# Patient Record
Sex: Female | Born: 1965 | Race: White | Hispanic: No | Marital: Married | Smoking: Current every day smoker
Health system: Southern US, Community
[De-identification: ages and names within clinical notes are randomized; demographics above are authoritative.]

## PROBLEM LIST (undated history)

## (undated) DIAGNOSIS — G43909 Migraine, unspecified, not intractable, without status migrainosus: Secondary | ICD-10-CM

## (undated) DIAGNOSIS — M2550 Pain in unspecified joint: Secondary | ICD-10-CM

## (undated) DIAGNOSIS — F419 Anxiety disorder, unspecified: Secondary | ICD-10-CM

## (undated) DIAGNOSIS — H269 Unspecified cataract: Secondary | ICD-10-CM

## (undated) DIAGNOSIS — D4959 Neoplasm of unspecified behavior of other genitourinary organ: Secondary | ICD-10-CM

## (undated) DIAGNOSIS — C439 Malignant melanoma of skin, unspecified: Secondary | ICD-10-CM

## (undated) HISTORY — DX: Anxiety disorder, unspecified: F41.9

## (undated) HISTORY — PX: CATARACT EXTRACTION, BILATERAL: SHX1313

## (undated) HISTORY — DX: Unspecified cataract: H26.9

## (undated) HISTORY — DX: Neoplasm of unspecified behavior of other genitourinary organ: D49.59

## (undated) HISTORY — DX: Pain in unspecified joint: M25.50

## (undated) HISTORY — PX: MOHS SURGERY: SUR867

## (undated) HISTORY — PX: APPENDECTOMY: SHX54

## (undated) HISTORY — PX: TONSILLECTOMY AND ADENOIDECTOMY: SUR1326

## (undated) HISTORY — DX: Malignant melanoma of skin, unspecified: C43.9

## (undated) HISTORY — DX: Migraine, unspecified, not intractable, without status migrainosus: G43.909

---
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
## 2000-11-09 HISTORY — PX: MELANOMA EXCISION: SHX5266

## 2004-09-16 ENCOUNTER — Ambulatory Visit: Payer: Self-pay | Admitting: Family Medicine

## 2004-10-24 ENCOUNTER — Ambulatory Visit: Payer: Self-pay | Admitting: Internal Medicine

## 2009-02-06 ENCOUNTER — Ambulatory Visit: Payer: Self-pay | Admitting: Family Medicine

## 2009-08-16 ENCOUNTER — Emergency Department: Payer: Self-pay | Admitting: Emergency Medicine

## 2011-11-10 HISTORY — PX: ESOPHAGOGASTRODUODENOSCOPY: SHX1529

## 2011-11-10 HISTORY — PX: COLONOSCOPY: SHX174

## 2012-04-05 ENCOUNTER — Ambulatory Visit: Payer: Self-pay | Admitting: Family Medicine

## 2012-06-30 ENCOUNTER — Ambulatory Visit: Payer: Self-pay | Admitting: Gastroenterology

## 2012-09-09 IMAGING — NM NUCLEAR MEDICINE HEPATOHBILIARY INCLUDE GB
2 series · 12 of 12 positions shown · non-contrast
Comparison: none

REASON FOR EXAM: abd pain
COMMENTS:
TECHNIQUE: Following the uneventful intravenous infusion of
radiopharmaceutical, dynamic anterior regional imaging was obtained over the
liver for 75 minutes.

[Series 1000: gallbladder dynamic (results) · 4.80mm/px · 6 of 60 frames shown]
[frame 6/60]
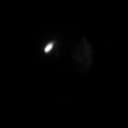
[frame 16/60]
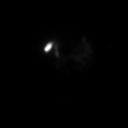
[frame 26/60]
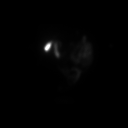
[frame 36/60]
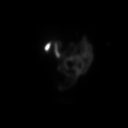
[frame 46/60]
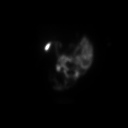
[frame 56/60]
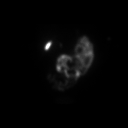

[Series 1000: gallbladder dynamic · 4.80mm/px · 6 of 60 frames shown]
[frame 6/60]
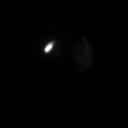
[frame 16/60]
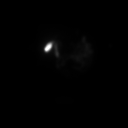
[frame 26/60]
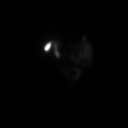
[frame 36/60]
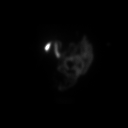
[frame 46/60]
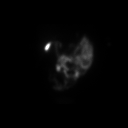
[frame 56/60]
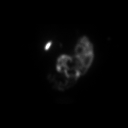

[12 of 12 positions shown; findings below may reference images not displayed]

PROCEDURE:     KNM - KNM HEPATO W/GB EJECT FRACTION  - June 30, 2012 [DATE]

RESULT:     Comparison: None.

Radiopharmaceutical: 8.78 mCi 8c-BBm labeled Choletec was administered
intravenously. Once the gallbladder had accumulated tracer, the patient was
given CCK intravenously per standard protocol.
FINDINGS: There is immediate homogeneous uptake of radiotracer in the liver.
Filling of the gallbladder begins at 15 minutes. Radiotracer uptake is
present in the small bowel at 75 minutes.

When gallbladder filling was complete, the patient was given an infusion of
1.49 mcg CCK over 30 minutes. At 30 minutes, the total ejection fraction was
80% which is within normal limits.
IMPRESSION: 1. The common bile duct and cystic duct are patent.
2. Normal gallbladder ejection fraction.

[REDACTED]

## 2016-03-31 ENCOUNTER — Encounter: Payer: Self-pay | Admitting: Internal Medicine

## 2016-03-31 ENCOUNTER — Ambulatory Visit (INDEPENDENT_AMBULATORY_CARE_PROVIDER_SITE_OTHER): Payer: BLUE CROSS/BLUE SHIELD | Admitting: Internal Medicine

## 2016-03-31 VITALS — BP 110/84 | HR 84 | Resp 16 | Ht 65.0 in | Wt 168.0 lb

## 2016-03-31 DIAGNOSIS — R1012 Left upper quadrant pain: Secondary | ICD-10-CM

## 2016-03-31 DIAGNOSIS — F17200 Nicotine dependence, unspecified, uncomplicated: Secondary | ICD-10-CM | POA: Insufficient documentation

## 2016-03-31 DIAGNOSIS — D0330 Melanoma in situ of unspecified part of face: Secondary | ICD-10-CM | POA: Diagnosis not present

## 2016-03-31 DIAGNOSIS — G43009 Migraine without aura, not intractable, without status migrainosus: Secondary | ICD-10-CM

## 2016-03-31 MED ORDER — DICYCLOMINE HCL 10 MG PO CAPS: 10.0000 mg | ORAL_CAPSULE | Freq: Three times a day (TID) | ORAL | Status: DC

## 2016-03-31 NOTE — Progress Notes (Signed)
Date:  03/31/2016   Name:  Sabrina Petersen   DOB:  [birth year]   MRN:  [id number]   Chief Complaint: Establish Care Gall Bladder slow releasing per GI 2-3 years ago. Did endoscopy as well as colonoscopy and U/S. Never said if it needed to be removed only said it could be. She is wondering if she needs second opinion. She also has pain under right rib and wonders if Tylenol has caused issue with Liver since she takes alot of it with her aspirin allergy.   Abdominal Pain This is a recurrent problem. The current episode started more than 1 year ago. The pain is located in the epigastric region and LUQ. The quality of the pain is sharp and cramping. The abdominal pain radiates to the LUQ. Associated symptoms include arthralgias, diarrhea, flatus, headaches and vomiting. Pertinent negatives include no constipation, fever or weight loss. The pain is relieved by certain positions (stretching to the left). Prior diagnostic workup includes lower endoscopy, upper endoscopy, GI consult and ultrasound.  Migraine  This is a recurrent problem. The current episode started more than 1 year ago. The problem has been gradually improving. Associated symptoms include abdominal pain and vomiting. Pertinent negatives include no dizziness, fever or weight loss. She has tried acetaminophen for the symptoms. The treatment provided significant relief.  Per patient - EGD and Colonoscopy were normal in 2013.  HIDA scan normal (viewed in [place]) She was never treated with any medication except maybe a brief of course of Nexium that was stopped due to lack of efficacy.  Previous evaluation by Dr Dionne Milo.  She has not changed her diet.  She is not aware of any foods that trigger symptoms.  Review of Systems  Constitutional: Negative for fever, chills, weight loss, fatigue and unexpected weight change.  Respiratory: Positive for shortness of breath (mild with exertion). Negative for chest tightness.   Cardiovascular: Negative  for chest pain, palpitations and leg swelling.  Gastrointestinal: Positive for vomiting, abdominal pain, diarrhea, abdominal distention and flatus. Negative for constipation, blood in stool and rectal pain.  Musculoskeletal: Positive for arthralgias.  Neurological: Positive for headaches. Negative for dizziness and light-headedness.  Hematological: Negative for adenopathy.    Patient Active Problem List   Diagnosis Date Noted  . 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 Authorizing Provider  acetaminophen (TYLENOL) 325 MG tablet Take 650 mg by mouth every 6 (six) hours as needed.   Yes Historical Provider, MD    Allergies  Allergen Reactions  . Aspirin Anaphylaxis and Swelling  . Keflex [Cephalexin] Shortness Of Breath and Palpitations    Past Surgical History  Procedure Laterality Date  . Appendectomy    . Cesarean section      2  . Tonsillectomy and adenoidectomy    . Esophagogastroduodenoscopy  2013  . Colonoscopy  2013  . Melanoma excision  2002  . Cataract extraction, bilateral      Social History  Substance Use Topics  . Smoking status: Current Every Day Smoker -- 1.00 packs/day for 28 years    Types: Cigarettes  . Smokeless tobacco: Never Used  . Alcohol Use: No     Medication list has been reviewed and updated.   Physical Exam  Constitutional: She is oriented to person, place, and time. She appears well-developed. No distress.  HENT:  Head: Normocephalic and atraumatic.  Neck: Normal range of motion. Neck supple.  No thyromegaly present.  Cardiovascular: Normal rate, regular rhythm and normal heart sounds.   Pulmonary/Chest: Effort normal and breath sounds normal. No respiratory distress. She has no wheezes. She has no rales.  Abdominal: Soft. Bowel sounds are normal. She exhibits no distension and no mass. There is tenderness in the right upper quadrant and epigastric area.  There is no rebound and no guarding.  Mild discomfort to deep palpation  Musculoskeletal: Normal range of motion.  Neurological: She is alert and oriented to person, place, and time.  Skin: Skin is warm and dry. No rash noted.  Psychiatric: She has a normal mood and affect. Her behavior is normal. Thought content normal.  Nursing note and vitals reviewed.   BP 110/84 mmHg  Pulse 84  Resp 16  Ht 5\' 5"  (1.651 m)  Wt 168 lb (76.204 kg)  BMI 27.96 kg/m2  SpO2 97%  LMP 03/13/2016  Assessment and Plan: 1. Left upper quadrant pain Will rule out H Pylori and treat empirically for IBS while waiting for GI evaluation - H. pylori antibody, IgG - dicyclomine (BENTYL) 10 MG capsule; Take 1 capsule (10 mg total) by mouth 4 (four) times daily -  before meals and at bedtime.  Dispense: 120 capsule; Refill: 1 - Ambulatory referral to Gastroenterology  2. Melanoma in situ of face [facility]) Recommend annual Dermatology follow up  3. Migraine without aura and without status migrainosus, not intractable Intermittent - responds to tylenol PRN   Halina Maidens, MD [facility]  03/31/2016

## 2016-04-01 LAB — H. PYLORI ANTIBODY, IGG: H Pylori IgG: <0.9 U/mL (ref 0.0–0.8)

## 2016-04-30 ENCOUNTER — Ambulatory Visit (INDEPENDENT_AMBULATORY_CARE_PROVIDER_SITE_OTHER): Payer: BLUE CROSS/BLUE SHIELD | Admitting: Gastroenterology

## 2016-04-30 ENCOUNTER — Encounter: Payer: Self-pay | Admitting: Gastroenterology

## 2016-04-30 VITALS — BP 134/65 | HR 74 | Temp 98.6°F | Ht 65.0 in | Wt 168.0 lb

## 2016-04-30 DIAGNOSIS — R1010 Upper abdominal pain, unspecified: Secondary | ICD-10-CM

## 2016-04-30 DIAGNOSIS — G8929 Other chronic pain: Secondary | ICD-10-CM

## 2016-04-30 DIAGNOSIS — K591 Functional diarrhea: Secondary | ICD-10-CM

## 2016-04-30 DIAGNOSIS — R1011 Right upper quadrant pain: Principal | ICD-10-CM

## 2016-04-30 NOTE — Progress Notes (Signed)
Gastroenterology Consultation  Referring Provider:     Glean Hess, MD Primary Care Physician:  Halina Maidens, MD Primary Gastroenterologist:  Dr. Allen Norris     Reason for Consultation:     Abdominal pain        HPI:   Sabrina Petersen is a 50 y.o. y/o female referred for consultation & management of Abdominal pain by Dr. Halina Maidens, MD.  This patient comes today with a report of abdominal pain.  The patient states that abdominal pain has been present for approximately a year.  The patient had a workup in the past by another gastroenterologist with an EGD and colonoscopy.  She also had a gallbladder emptying study that showed her gallbladder to be working fine.  The patient states that her symptoms are now associated with diarrhea.  She states she had diarrhea with the last episode but it resolved spontaneously.  The patient was started on dicyclomine and states that it just made her feel bad and did not help and if her symptoms.  She also reports that her abdominal pain as a crampy abdominal pain.  She feels better when she raises her right arm and stretches the area out.  There is no report of any black stools or bloody stools.  She also denies that the pain is made any better or worse with Greasy foods. The patient reports that when she has the diarrhea she has urgency.  Typically she states that when she is away from home visiting somebody else she has a hard time going to the bathroom because she is more comfortable at her own house and gives me the example that she can go a week without any diarrhea but it was start the second she comes home.  Past Medical History  Diagnosis Date  . Migraines   . Joint pain   . Melanoma ([place])   . Cataract     L and R  . Anxiety     Past Surgical History  Procedure Laterality Date  . Appendectomy    . Cesarean section      2  . Tonsillectomy and adenoidectomy    . Esophagogastroduodenoscopy  2013  . Colonoscopy  2013  . Melanoma excision   2002  . Cataract extraction, bilateral      Prior to Admission medications   Medication Sig Start Date End Date Taking? Authorizing Provider  acetaminophen (TYLENOL) 325 MG tablet Take 650 mg by mouth every 6 (six) hours as needed.   Yes Historical Provider, MD  dicyclomine (BENTYL) 10 MG capsule Take 1 capsule (10 mg total) by mouth 4 (four) times daily -  before meals and at bedtime. Patient not taking: Reported on 04/30/2016 03/31/16   Glean Hess, MD    Family History  Problem Relation Age of Onset  . Leukemia Mother   . Heart attack Father   . CAD Paternal Grandfather      Social History  Substance Use Topics  . Smoking status: Current Every Day Smoker -- 1.00 packs/day for 28 years    Types: Cigarettes  . Smokeless tobacco: Never Used  . Alcohol Use: No    Allergies as of 04/30/2016 - Review Complete 04/30/2016  Allergen Reaction Noted  . Aspirin Anaphylaxis and Swelling 03/31/2016  . Keflex [cephalexin] Shortness Of Breath and Palpitations 03/31/2016    Review of Systems:    All systems reviewed and negative except where noted in HPI.   Physical Exam:  BP 134/65 mmHg  Pulse 74  Temp(Src) 98.6 F (37 C) (Oral)  Ht 5\' 5"  (1.651 m)  Wt 168 lb (76.204 kg)  BMI 27.96 kg/m2  LMP 03/13/2016 Patient's last menstrual period was 03/13/2016. Psych:  Alert and cooperative. Normal mood and affect. General:   Alert,  Well-developed, well-nourished, pleasant and cooperative in NAD Head:  Normocephalic and atraumatic. Eyes:  Sclera clear, no icterus.   Conjunctiva pink. Ears:  Normal auditory acuity. Nose:  No deformity, discharge, or lesions. Mouth:  No deformity or lesions,oropharynx pink & moist. Neck:  Supple; no masses or thyromegaly. Lungs:  Respirations even and unlabored.  Clear throughout to auscultation.   No wheezes, crackles, or rhonchi. No acute distress. Heart:  Regular rate and rhythm; no murmurs, clicks, rubs, or gallops. Abdomen:  Normal bowel sounds.   No bruits.  Soft, Positive tenderness to palpation of the right upper quadrant which is present with the muscles flexed but is worse with the muscles relaxed and non-distended without masses, hepatosplenomegaly or hernias noted.  No guarding or rebound tenderness.     Rectal:  Deferred.  Msk:  Symmetrical without gross deformities.  Good, equal movement & strength bilaterally. Pulses:  Normal pulses noted. Extremities:  No clubbing or edema.  No cyanosis. Neurologic:  Alert and oriented x3;  grossly normal neurologically. Skin:  Intact without significant lesions or rashes.  No jaundice. Lymph Nodes:  No significant cervical adenopathy. Psych:  Alert and cooperative. Normal mood and affect.  Imaging Studies: No results found.  Assessment and Plan:   Sabrina Petersen is a 50 y.o. y/o female Who comes in today with a report of symptoms consistent with irritable bowel syndrome.  The patient's gallbladder emptying study had been negative in the past and so has her EGD and colonoscopy.  The patient is having urgency typically when she is home and states that she has a hard time moving her bowels when she is away from home except some times at work.  It was not helped by dicyclomine and she states that the side effects were worse.  The patient has been started on fiber to help bulk up her stools and avoid diarrhea.  She also states that she takes some homeopathic seeds to also help with firming up her stools. It is unlikely that this is the patient's gallbladder as the cause for symptoms. If the above does not work the patient has been told to take Imodium for her diarrhea and see if that helps with her intestinal spasms.  She has been told that if none of these things help she may need to undergo a repeat colonoscopy with biopsies to rule out microscopic colitis.The patient has been explained the plan and agrees with it   Note: This dictation was prepared with Dragon dictation along with smaller phrase  technology. Any transcriptional errors that result from this process are unintentional.

## 2016-06-23 ENCOUNTER — Encounter: Payer: Self-pay | Admitting: Emergency Medicine

## 2016-06-23 ENCOUNTER — Ambulatory Visit
Admission: EM | Admit: 2016-06-23 | Discharge: 2016-06-23 | Disposition: A | Discharge status: Home / Self Care | Payer: BLUE CROSS/BLUE SHIELD | Attending: Family Medicine | Admitting: Family Medicine

## 2016-06-23 DIAGNOSIS — M79602 Pain in left arm: Secondary | ICD-10-CM | POA: Diagnosis not present

## 2016-06-23 DIAGNOSIS — T148 Other injury of unspecified body region: Secondary | ICD-10-CM | POA: Diagnosis not present

## 2016-06-23 DIAGNOSIS — T148XXA Other injury of unspecified body region, initial encounter: Secondary | ICD-10-CM

## 2016-06-23 MED ORDER — METHOCARBAMOL 750 MG PO TABS: 1500.0000 mg | ORAL_TABLET | Freq: Three times a day (TID) | ORAL | 0 refills | Status: DC

## 2016-06-23 NOTE — Discharge Instructions (Signed)
Take medication as prescribed. Rest. Apply ice. Use sling and rest arm. Perform range of motion exercises multiple times per day as discussed.   Follow up with primary care or orthopedic this week for continued pain as discussed.   Follow up with your primary care physician this week as needed. Return to Urgent care for new or worsening concerns.

## 2016-06-23 NOTE — ED Provider Notes (Signed)
[facility] ____________________________________________  Time seen: Approximately 9:31 AM  I have reviewed the triage vital signs and the nursing notes.   HISTORY  Chief Complaint Arm Pain (left arm)   HPI Sabrina Petersen is a 50 y.o. female presents with complaint of left arm pain. Patient reports pain onset was 2 days ago. Patient reports she does often have some pain in her left upper arm and left shoulder and she frequently carries heavy items and a heavy purse. Patient reports 2 days ago she was walking through her house, and her child's left a dumbbell sitting in the floor. Patient reports that she hit her foot on the dumbbell causing her to go off balance and jerk her left arm back. Patient reports that in this process she had sudden onset of pain to her left upper arm. Patient reports the last 2 days she has continued with the pain. Patient reports his pain is been unresolved with over-the-counter Tylenol. Patient reports she is allergic to NSAIDs.  Reports is not uncommon for her to occasionally have some pain in the same area reports pain onset was directly after injury 2 days ago. Patient reports pain is primarily with movement. Patient reports pain is fully reproducible with direct palpation and active movement. Patient reports that if she is supporting her arm and sitting complete distal pain is mild but reports pain is moderate with movement. Patient states that she does still have full range of motion but some pain. Denies numbness or loss of sensation in left arm. Denies weakened hand grips or decrease hand sensation.   Denies fall or direct trauma. Denies any pain radiation. Denies recent sickness. Denies chest pain, shortness of breath, abdominal pain, abdominal pain, dysuria, neck or back pain. Denies any pain or injury.  PCP: [name]    Past Medical History:  Diagnosis Date  . Anxiety   . Cataract    L and R  . Joint pain   . Melanoma ([place])   .  Migraines     Patient Active Problem List   Diagnosis Date Noted  . 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 Tobacco use disorder 03/31/2016  . Left upper quadrant pain 03/31/2016  . Migraine without aura and without status migrainosus, not intractable 03/31/2016    Past Surgical History:  Procedure Laterality Date  . APPENDECTOMY    . CATARACT EXTRACTION, BILATERAL    . CESAREAN SECTION     2  . COLONOSCOPY  2013  . ESOPHAGOGASTRODUODENOSCOPY  2013  . MELANOMA EXCISION  2002  . TONSILLECTOMY AND ADENOIDECTOMY       No current facility-administered medications for this encounter.   Current Outpatient Prescriptions:  .  acetaminophen (TYLENOL) 325 MG tablet, Take 650 mg by mouth every 6 (six) hours as needed., Disp: , Rfl:  .  methocarbamol (ROBAXIN-750) 750 MG tablet, Take 2 tablets (1,500 mg total) by mouth 3 (three) times daily. Do not drive or operate machinery as can cause drowsiness., Disp: 18 tablet, Rfl: 0  Allergies Aspirin and Keflex [cephalexin]  Family History  Problem Relation Age of Onset  . Leukemia Mother   . Heart attack Father   . CAD Paternal Grandfather     Social History Social History  Substance Use Topics  . Smoking status: Current Every Day Smoker    Packs/day: 1.00    Years: 28.00    Types: Cigarettes  . Smokeless tobacco: Never Used  . Alcohol use No    Review of  Systems Constitutional: No fever/chills Eyes: No visual changes. ENT: No sore throat. Cardiovascular: Denies chest pain. Respiratory: Denies shortness of breath. Gastrointestinal: No abdominal pain.  No nausea, no vomiting.  No diarrhea.  No constipation. Genitourinary: Negative for dysuria. Musculoskeletal: Negative for back pain. as above.  Skin: Negative for rash. Neurological: Negative for headaches, focal weakness or numbness.  10-point ROS otherwise negative.  ____________________________________________   PHYSICAL EXAM:  VITAL SIGNS: ED Triage Vitals    Enc Vitals Group     BP 06/23/16 0826 135/75     Pulse Rate 06/23/16 0826 92     Resp 06/23/16 0826 16     Temp 06/23/16 0826 97.1 F (36.2 C)     Temp Source 06/23/16 0826 Tympanic     SpO2 06/23/16 0826 100 %     Weight --      Height --      Head Circumference --      Peak Flow --      Pain Score 06/23/16 0829 5     Pain Loc --      Pain Edu? --      Excl. in [place]? --     Constitutional: Alert and oriented. Well appearing and in no acute distress. Eyes: Conjunctivae are normal. PERRL. EOMI. ENT      Head: Normocephalic and atraumatic.      Nose: No congestion/rhinnorhea.      Mouth/Throat: Mucous membranes are moist. Cardiovascular: Normal rate, regular rhythm. Grossly normal heart sounds.  Good peripheral circulation. Respiratory: Normal respiratory effort without tachypnea nor retractions. Breath sounds are clear and equal bilaterally. No wheezes/rales/rhonchi. No chest or rib tenderness.  Gastrointestinal: Soft and nontender. No distention. No CVA tenderness.  Musculoskeletal:  Nontender with normal range of motion in all extremities. No midline cervical, thoracic or lumbar tenderness to palpation.   except : Left mid to proximal bicep muscle moderate tenderness to palpation and moderate pain with resisted right elbow flexion at same location, no pain with resisted right elbow extension , mild to moderate pain to right bicep with left arm abduction, negative drop arm test, negative impingement test, bilateral hand grips strong and equal, bilateral distal radial pulses equal and easily palpated. Left hand no motor or tendon deficits, easily able to touch each finger with left thumb, left-hand normal capillary refill and normal sensation. Left upper extremity otherwise nontender. No bony tenderness to left upper extremity.  Neurologic:  Normal speech and language. No gross focal neurologic deficits are appreciated. Speech is normal. No gait instability.  Skin:  Skin is warm, dry and  intact. No rash noted. Psychiatric: Mood and affect are normal. Speech and behavior are normal. Patient exhibits appropriate insight and judgment   ___________________________________________   LABS (all labs ordered are listed, but only abnormal results are displayed)  Labs Reviewed - No data to display   PROCEDURES Procedures   Left arm sling applied by RN. Neurovascular intact post application. __________________________________________   INITIAL IMPRESSION / ASSESSMENT AND PLAN / ED COURSE  Pertinent labs & imaging results that were available during my care of the patient were reviewed by me and considered in my medical decision making (see chart for details).  Well-appearing patient. No acute distress. Presents for the complaints of left arm pain. Patient denies fall or direct trauma. Reports some tension and left proximal arm at baseline from carrying heavy objects but reports pain immediately onset after she jerked her arm. Pain per patient is fully reproducible by direct palpation  and movement. Denies any pain radiation. Left arm without bony tenderness and patient denies trauma. Discussed with patient, will defer x-ray at this time and patient agrees to this. Patient is point muscular tender. Suspect muscular strain, counseled regarding conservative treatment and rest. Encourage patient to follow-up for any continued pain complaints due to potential for muscular tear. Will place patient in sling. Counseled regarding range of motion exercises such as pendulum exercises to be performed multiple times per day. Will treat patient with oral Robaxin. Information for orthopedic given for follow-up as needed. Discussed indication, risks and benefits of medications with patient.  Discussed follow up with Primary care physician this week. Discussed follow up and return parameters including no resolution or any worsening concerns. Patient verbalized understanding and agreed to plan.    ____________________________________________   FINAL CLINICAL IMPRESSION(S) / ED DIAGNOSES  Final diagnoses:  Left arm pain  Muscle strain     Discharge Medication List as of 06/23/2016  9:05 AM    START taking these medications   Details  methocarbamol (ROBAXIN-750) 750 MG tablet Take 2 tablets (1,500 mg total) by mouth 3 (three) times daily. Do not drive or operate machinery as can cause drowsiness., Starting Tue 06/23/2016, Normal        Note: This dictation was prepared with Dragon dictation along with smaller phrase technology. Any transcriptional errors that result from this process are unintentional.    Clinical Course      Marylene Land, NP 06/23/16 [phone]

## 2016-06-23 NOTE — ED Triage Notes (Signed)
Patient states that she started having left arm pain after she had jerked her left arm on Sunday night.  Patient reports increase pain when she raises her left arm.  Patient denies chest pain or SOB.

## 2016-07-21 ENCOUNTER — Ambulatory Visit
Admission: EM | Admit: 2016-07-21 | Discharge: 2016-07-21 | Disposition: A | Discharge status: Home / Self Care | Payer: BLUE CROSS/BLUE SHIELD | Attending: Family Medicine | Admitting: Family Medicine

## 2016-07-21 ENCOUNTER — Ambulatory Visit
Admit: 2016-07-21 | Discharge: 2016-07-21 | Disposition: A | Discharge status: Home / Self Care | Payer: BLUE CROSS/BLUE SHIELD | Attending: Emergency Medicine | Admitting: Emergency Medicine

## 2016-07-21 DIAGNOSIS — S29009A Unspecified injury of muscle and tendon of unspecified wall of thorax, initial encounter: Secondary | ICD-10-CM

## 2016-07-21 DIAGNOSIS — S29019A Strain of muscle and tendon of unspecified wall of thorax, initial encounter: Secondary | ICD-10-CM

## 2016-07-21 DIAGNOSIS — F17210 Nicotine dependence, cigarettes, uncomplicated: Secondary | ICD-10-CM | POA: Diagnosis not present

## 2016-07-21 DIAGNOSIS — F419 Anxiety disorder, unspecified: Secondary | ICD-10-CM | POA: Insufficient documentation

## 2016-07-21 DIAGNOSIS — S29012A Strain of muscle and tendon of back wall of thorax, initial encounter: Secondary | ICD-10-CM | POA: Diagnosis not present

## 2016-07-21 DIAGNOSIS — X58XXXA Exposure to other specified factors, initial encounter: Secondary | ICD-10-CM | POA: Insufficient documentation

## 2016-07-21 LAB — URINALYSIS COMPLETE WITH MICROSCOPIC (ARMC ONLY)
Bacteria, UA: NONE SEEN
Bilirubin Urine: NEGATIVE
Glucose, UA: NEGATIVE mg/dL
Ketones, ur: NEGATIVE mg/dL
Leukocytes, UA: NEGATIVE
Nitrite: NEGATIVE
Protein, ur: NEGATIVE mg/dL
RBC / HPF: NONE SEEN RBC/hpf (ref 0–5)
Specific Gravity, Urine: <1.005 — ABNORMAL LOW (ref 1.005–1.030)
WBC, UA: NONE SEEN WBC/hpf (ref 0–5)
pH: 5 (ref 5.0–8.0)

## 2016-07-21 MED ORDER — DIAZEPAM 2 MG PO TABS: 2.0000 mg | ORAL_TABLET | Freq: Three times a day (TID) | ORAL | 0 refills | Status: DC

## 2016-07-21 MED ORDER — TIZANIDINE HCL 4 MG PO TABS: 4.0000 mg | ORAL_TABLET | Freq: Four times a day (QID) | ORAL | 0 refills | Status: DC | PRN

## 2016-07-21 NOTE — ED Provider Notes (Signed)
CSN: [id number]     Arrival date & time 07/21/16  1332 History   First MD Initiated Contact with Patient 07/21/16 1434     Chief Complaint  Patient presents with  . Spasms   (Consider location/radiation/quality/duration/timing/severity/associated sxs/prior Treatment) HPI  50 year old female who presents with back spasms where she indicates thoracolumbar area. He said it hurts when she takes any deep breath. Sometimes radiate to the front. 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 She said yesterday it was very painful even at rest day seems to be mostly noticed when she takes in a deep breath. It is the pain is a 7 day 8 out of 10 at those times. She is sitting quietly she states that the pain is not so bad. He said no nausea or vomiting. She's noticed no urinary symptoms.      Past Medical History:  Diagnosis Date  . Anxiety   . Cataract    L and R  . Joint pain   . Melanoma ([place])   . Migraines    Past Surgical History:  Procedure Laterality Date  . APPENDECTOMY    . CATARACT EXTRACTION, BILATERAL    . CESAREAN SECTION     2  . COLONOSCOPY  2013  . ESOPHAGOGASTRODUODENOSCOPY  2013  . MELANOMA EXCISION  2002  . TONSILLECTOMY AND ADENOIDECTOMY     Family History  Problem Relation Age of Onset  . Leukemia Mother   . Heart attack Father   . CAD Paternal Grandfather    Social History  Substance Use Topics  . Smoking status: Current Every Day Smoker    Packs/day: 1.00    Years: 28.00    Types: Cigarettes  . Smokeless tobacco: Never Used  . Alcohol use No   OB History    No data available     Review of Systems  Constitutional: Positive for activity change. Negative for appetite change, chills, fatigue and fever.  Gastrointestinal: Positive for abdominal pain. Negative for abdominal distention, constipation, diarrhea, nausea and rectal pain.  Musculoskeletal: Positive for back pain.  All  other systems reviewed and are negative.   Allergies  Aspirin; Keflex [cephalexin]; and Kiwi extract  Home Medications   Prior to Admission medications   Medication Sig Start Date End Date Taking? Authorizing Provider  acetaminophen (TYLENOL) 325 MG tablet Take 650 mg by mouth every 6 (six) hours as needed.    Historical Provider, MD  diazepam (VALIUM) 2 MG tablet Take 1 tablet (2 mg total) by mouth 3 (three) times daily. 07/21/16   Lorin Picket, PA-C  tiZANidine (ZANAFLEX) 4 MG tablet Take 1 tablet (4 mg total) by mouth every 6 (six) hours as needed for muscle spasms. 07/21/16   Lorin Picket, PA-C   Meds Ordered and Administered this Visit  Medications - No data to display  BP (!) 134/55 (BP Location: Left Arm)   Pulse 84   Temp 97.1 F (36.2 C) (Tympanic)   Resp 16   Ht 5\' 5"  (1.651 m)   Wt 165 lb (74.8 kg)   LMP 07/06/2016   SpO2 100%   BMI 27.46 kg/m  No data found.   Physical Exam  Constitutional: She is oriented to person, place, and time. She appears well-developed and well-nourished. No distress.  HENT:  Head: Normocephalic and atraumatic.  Eyes: EOM are normal. Pupils are equal, round, and reactive to light. Right eye exhibits no  discharge. Left eye exhibits no discharge.  Neck: Normal range of motion. Neck supple.  Pulmonary/Chest: Breath sounds normal. No respiratory distress. She has no wheezes. She has no rales.  Patient has splinting of her inspiratory effort because of pain  Abdominal: Soft. Bowel sounds are normal. She exhibits no distension and no mass. There is tenderness. There is no rebound and no guarding.  The patient exhibits positive Murphy sign duration with pain radiating into her shoulder and back  Musculoskeletal: She exhibits no edema or deformity.  Thoracic rotation at the extremes does give mild discomfort in the right thoracolumbar area in the paraspinous muscle on the right. There is no CVA tenderness present.  Neurological: She is  alert and oriented to person, place, and time.  Skin: Skin is warm and dry. She is not diaphoretic.  Psychiatric: She has a normal mood and affect. Her behavior is normal. Judgment and thought content normal.  Nursing note and vitals reviewed.   Urgent Care Course   Clinical Course    Procedures (including critical care time)  Labs Review Labs Reviewed  URINALYSIS COMPLETEWITH MICROSCOPIC ([facility] ONLY) - Abnormal; Notable for the following:       Result Value   Color, Urine STRAW (*)    Specific Gravity, Urine <1.005 (*)    Hgb urine dipstick TRACE (*)    Squamous Epithelial / LPF 0-5 (*)    All other components within normal limits    Imaging Review US Abdomen Limited Ruq  Result Date: 07/21/2016 CLINICAL DATA:  Right upper quadrant pain. EXAM: US ABDOMEN LIMITED - RIGHT UPPER QUADRANT COMPARISON:  04/05/2012 FINDINGS: Gallbladder: Gallbladder has a normal appearance. Gallbladder wall is 2.1 mm, within normal limits. No stones or pericholecystic fluid. No sonographic Murphy's sign. Common bile duct: Diameter: 4.2 mm Liver: A small cyst is identified in the left hepatic lobe measuring 1.5 x 1.2 x 1.7 cm, similar in appearance to previous CT exam. IMPRESSION: No evidence for acute  abnormality.  Stable left hepatic cyst. Electronically Signed   By: Nolon Nations M.D.   On: 07/21/2016 16:42     Visual Acuity Review  Right Eye Distance:   Left Eye Distance:   Bilateral Distance:    Right Eye Near:   Left Eye Near:    Bilateral Near:     The patient is allergic to NSAIDs     MDM   1. Thoracic myofascial strain, initial encounter    New Prescriptions   DIAZEPAM (VALIUM) 2 MG TABLET    Take 1 tablet (2 mg total) by mouth 3 (three) times daily.   TIZANIDINE (ZANAFLEX) 4 MG TABLET    Take 1 tablet (4 mg total) by mouth every 6 (six) hours as needed for muscle spasms.  Plan: 1. Test/x-ray results and diagnosis reviewed with patient 2. rx as per orders; risks, benefits,  potential side effects reviewed with patient 3. Recommend supportive treatment with Symptom avoidance and heat to the area for comfort. Does not appear that she has a kidney stone and her gallbladder was normal by ultrasound. In all probability he has an intercostal strain from lifting and twisting large cases of water over the weekend. I recommended that she follow-up with Dr. Chrisandra Netters her primary care physician if she continues to have discomfort week 4. F/u prn if symptoms worsen or don't improve     Lorin Picket, PA-C 07/21/16 1710

## 2016-07-21 NOTE — ED Triage Notes (Signed)
Patient complains of back spasms. Patient states that this started after lifting on Saturday. Patient states that it also hurts when she takes a deep breath.

## 2018-04-28 IMAGING — US US ABDOMEN LIMITED
1 series · 14 of 25 positions shown · non-contrast
Comparison: 04/05/2012

CLINICAL DATA: Right upper quadrant pain.

EXAM:
US ABDOMEN LIMITED - RIGHT UPPER QUADRANT

[Series 1: us abdomen limited · 0.23mm/px · 14 of 48 slices shown]
[im 1/48]
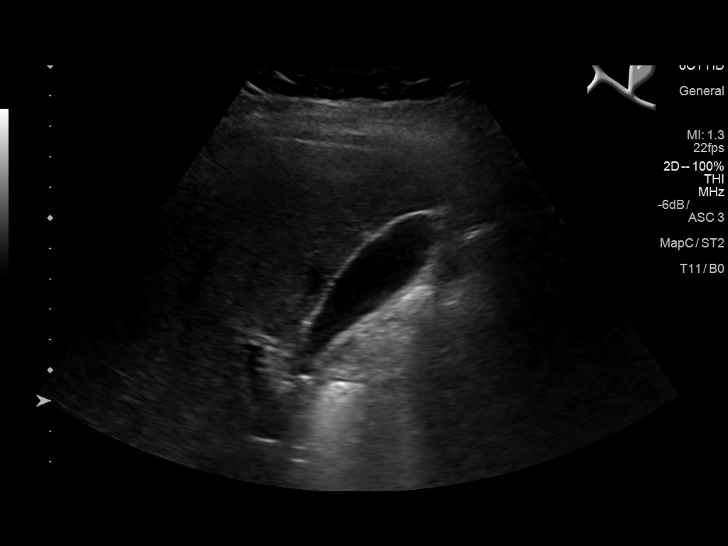
[im 4/48]
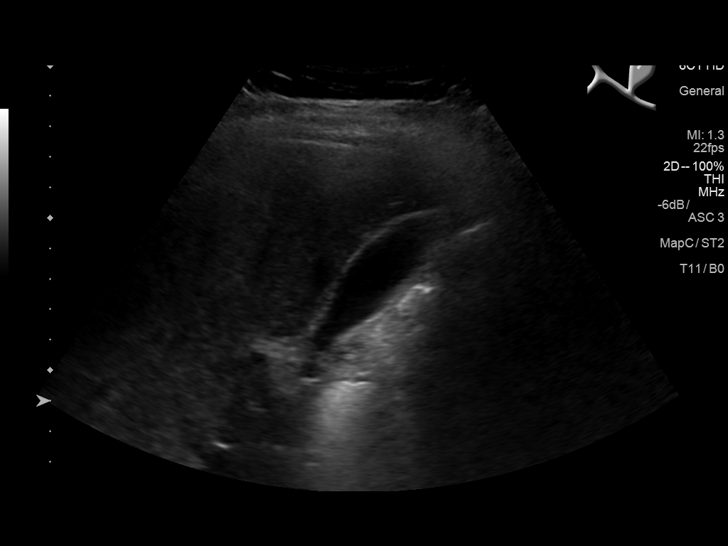
[im 8/48]
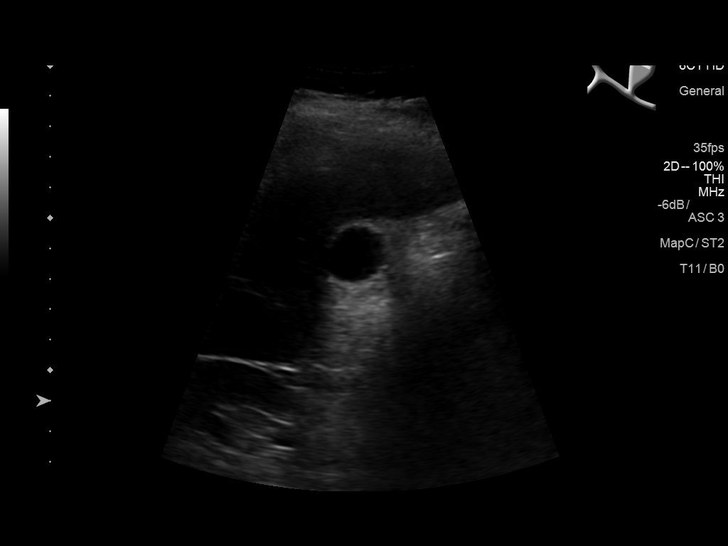
[im 12/48]
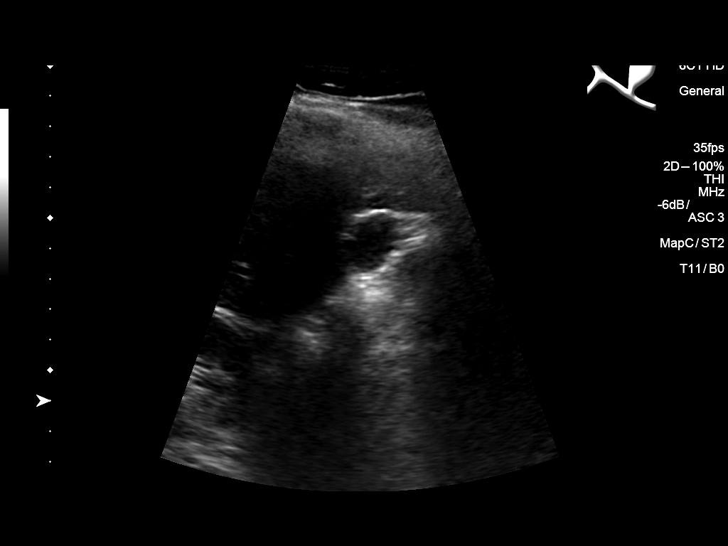
[im 16/48]
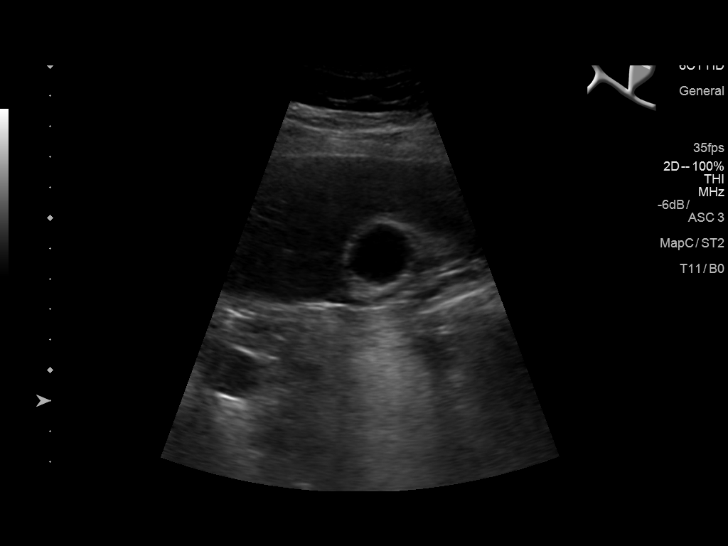
[im 18/48]
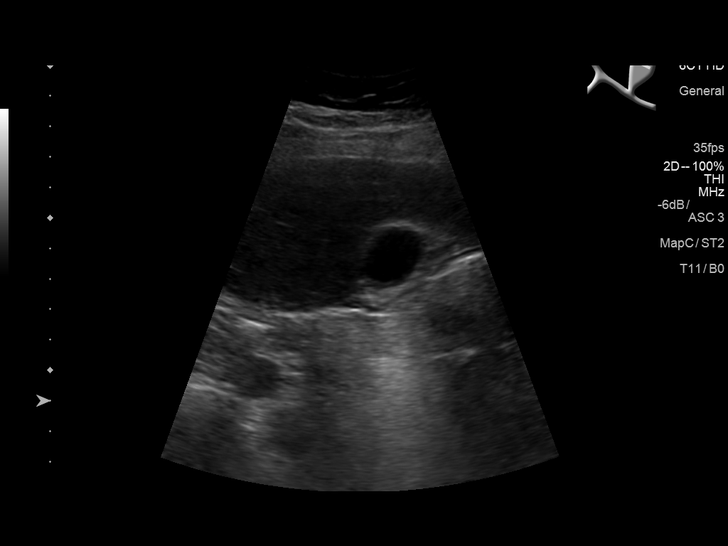
[im 22/48]
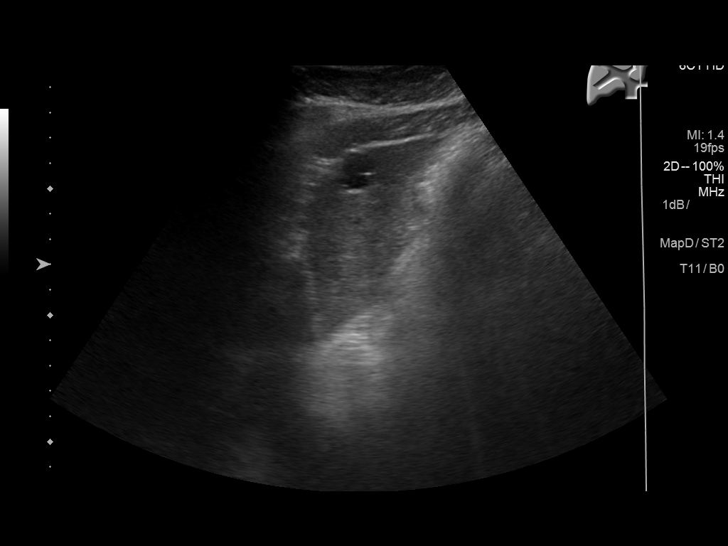
[im 26/48]
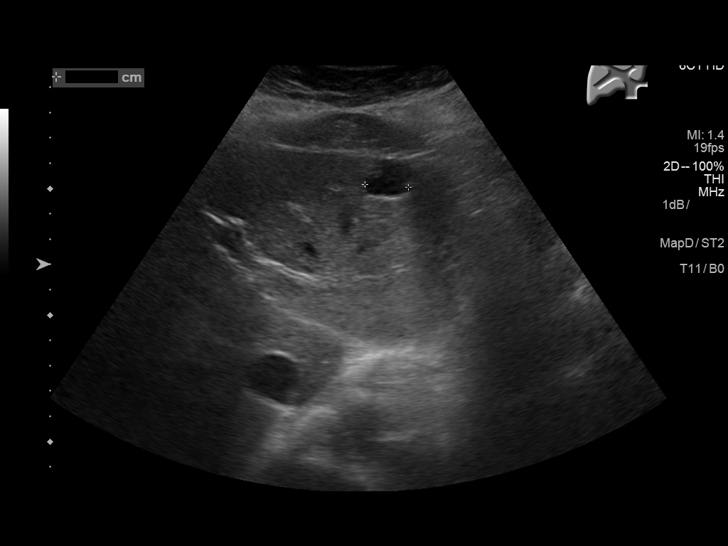
[im 30/48]
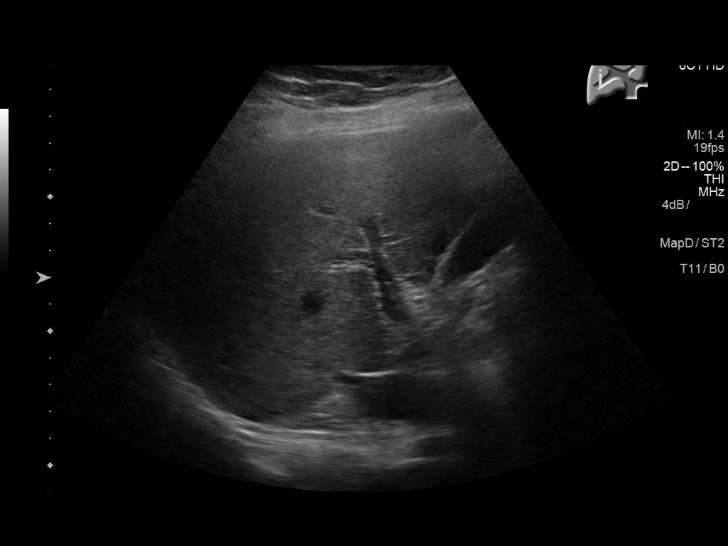
[im 32/48]
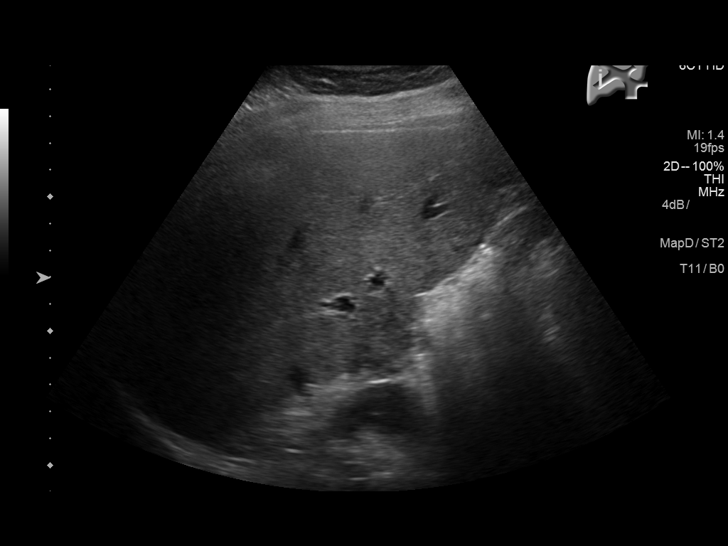
[im 36/48]
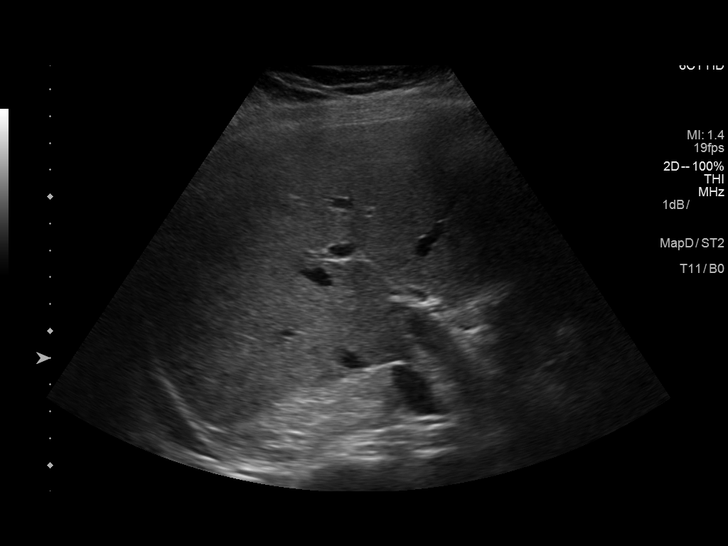
[im 40/48]
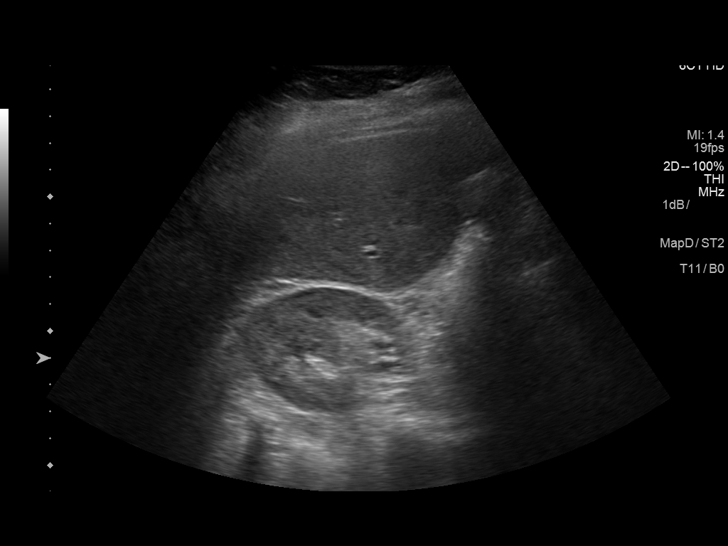
[im 44/48]
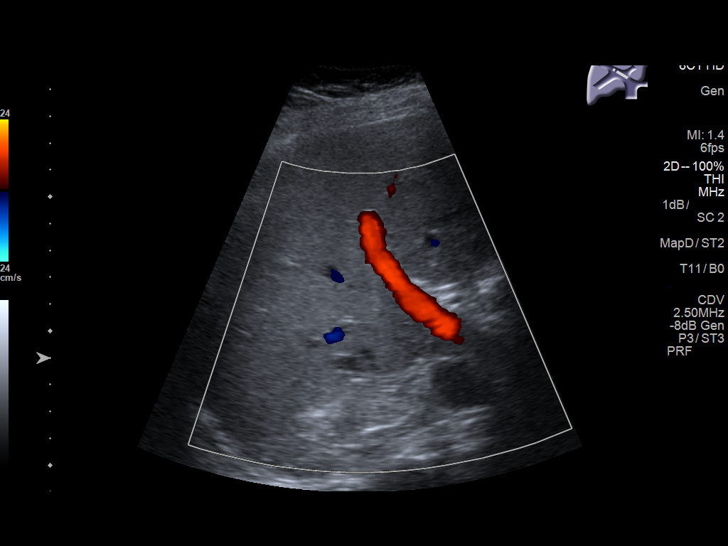
[im 48/48]
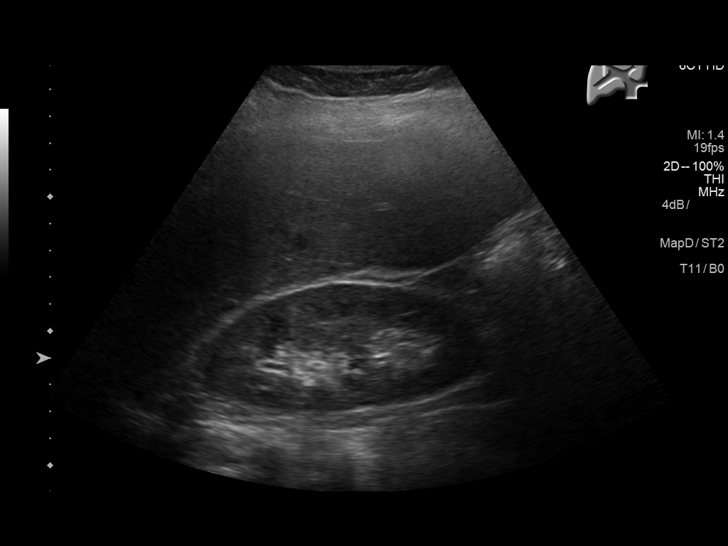

[14 of 25 positions shown; findings below may reference images not displayed]

FINDINGS: Gallbladder:

Gallbladder has a normal appearance. Gallbladder wall is 2.1 mm,
within normal limits. No stones or pericholecystic fluid. No
sonographic Murphy's sign.

Common bile duct:

Diameter: 4.2 mm

Liver:

A small cyst is identified in the left hepatic lobe measuring 1.5 x
1.2 x 1.7 cm, similar in appearance to previous CT exam.
IMPRESSION: No evidence for acute  abnormality.  Stable left hepatic cyst.

## 2019-01-20 ENCOUNTER — Other Ambulatory Visit: Payer: Self-pay

## 2019-01-20 ENCOUNTER — Other Ambulatory Visit (HOSPITAL_COMMUNITY)
Admission: RE | Admit: 2019-01-20 | Discharge: 2019-01-20 | Disposition: A | Discharge status: Home / Self Care | Payer: 59 | Source: Ambulatory Visit | Attending: Obstetrics & Gynecology | Admitting: Obstetrics & Gynecology

## 2019-01-20 ENCOUNTER — Other Ambulatory Visit: Payer: Self-pay | Admitting: Obstetrics & Gynecology

## 2019-01-20 ENCOUNTER — Ambulatory Visit (INDEPENDENT_AMBULATORY_CARE_PROVIDER_SITE_OTHER): Payer: 59 | Admitting: Obstetrics & Gynecology

## 2019-01-20 ENCOUNTER — Encounter: Payer: Self-pay | Admitting: Obstetrics & Gynecology

## 2019-01-20 VITALS — BP 122/80 | Ht 65.0 in | Wt 161.0 lb

## 2019-01-20 DIAGNOSIS — N9419 Other specified dyspareunia: Secondary | ICD-10-CM

## 2019-01-20 DIAGNOSIS — Z1211 Encounter for screening for malignant neoplasm of colon: Secondary | ICD-10-CM

## 2019-01-20 DIAGNOSIS — Z124 Encounter for screening for malignant neoplasm of cervix: Secondary | ICD-10-CM | POA: Insufficient documentation

## 2019-01-20 DIAGNOSIS — Z131 Encounter for screening for diabetes mellitus: Secondary | ICD-10-CM

## 2019-01-20 DIAGNOSIS — Z1239 Encounter for other screening for malignant neoplasm of breast: Secondary | ICD-10-CM

## 2019-01-20 DIAGNOSIS — N952 Postmenopausal atrophic vaginitis: Secondary | ICD-10-CM

## 2019-01-20 DIAGNOSIS — Z1329 Encounter for screening for other suspected endocrine disorder: Secondary | ICD-10-CM

## 2019-01-20 DIAGNOSIS — Z01419 Encounter for gynecological examination (general) (routine) without abnormal findings: Secondary | ICD-10-CM | POA: Diagnosis not present

## 2019-01-20 DIAGNOSIS — Z1321 Encounter for screening for nutritional disorder: Secondary | ICD-10-CM

## 2019-01-20 DIAGNOSIS — Z13220 Encounter for screening for lipoid disorders: Secondary | ICD-10-CM

## 2019-01-20 MED ORDER — REPLENS VA GEL: 1.0000 | VAGINAL | 11 refills | Status: AC

## 2019-01-20 NOTE — Progress Notes (Signed)
HPI:      Ms. Sabrina Petersen is a 53 y.o. G2P2002 who LMP was in the past, she presents today for her annual examination.  The patient has no complaints today. The patient is sexually active. Herlast pap: approximate date years ago and was normal and last mammogram: approximate date 2017 and was normal.  The patient does perform self breast exams.  There is no notable family history of breast or ovarian cancer in her family. The patient is not taking hormone replacement therapy. Patient denies post-menopausal vaginal bleeding.   The patient has regular exercise: yes. The patient denies current symptoms of depression.    GYN Hx: No recent colonoscopy Last period was 04/2018 Rare hot flash Does have vaginal dryness, dyspareunia and low libido  PMHx: Past Medical History:  Diagnosis Date  . Anxiety   . Cataract    L and R  . Cervical neoplasm 1985  . Joint pain   . Melanoma ([place])   . Migraines    Past Surgical History:  Procedure Laterality Date  . APPENDECTOMY    . CATARACT EXTRACTION, BILATERAL    . CESAREAN SECTION     2  . COLONOSCOPY  2013  . ESOPHAGOGASTRODUODENOSCOPY  2013  . MELANOMA EXCISION  2002  . MOHS SURGERY    . TONSILLECTOMY AND ADENOIDECTOMY     Family History  Problem Relation Age of Onset  . Leukemia Mother   . Heart attack Father   . CAD Paternal Grandfather    Social History   Tobacco Use  . Smoking status: Current Every Day Smoker    Packs/day: 1.00    Years: 28.00    Pack years: 28.00    Types: Cigarettes  . Smokeless tobacco: Never Used  Substance Use Topics  . Alcohol use: No    Alcohol/week: 0.0 standard drinks  . Drug use: No    Current Outpatient Medications:  .  acetaminophen (TYLENOL) 325 MG tablet, Take 650 mg by mouth every 6 (six) hours as needed., Disp: , Rfl:  .  diazepam (VALIUM) 2 MG tablet, Take 1 tablet (2 mg total) by mouth 3 (three) times daily. (Patient not taking: Reported on 01/20/2019), Disp: 6 tablet, Rfl: 0 .   tiZANidine (ZANAFLEX) 4 MG tablet, Take 1 tablet (4 mg total) by mouth every 6 (six) hours as needed for muscle spasms. (Patient not taking: Reported on 01/20/2019), Disp: 30 tablet, Rfl: 0 .  [START ON 01/23/2019] Vaginal Lubricant (REPLENS) GEL, Place 1 Applicatorful vaginally 2 (two) times a week., Disp: 35 g, Rfl: 11 Allergies: Aspirin; Keflex [cephalexin]; and Kiwi extract  Review of Systems  Constitutional: Negative for chills, fever and malaise/fatigue.  HENT: Negative for congestion, sinus pain and sore throat.   Eyes: Negative for blurred vision and pain.  Respiratory: Negative for cough and wheezing.   Cardiovascular: Negative for chest pain and leg swelling.  Gastrointestinal: Negative for abdominal pain, constipation, diarrhea, heartburn, nausea and vomiting.  Genitourinary: Negative for dysuria, frequency, hematuria and urgency.  Musculoskeletal: Negative for back pain, joint pain, myalgias and neck pain.  Skin: Negative for itching and rash.  Neurological: Negative for dizziness, tremors and weakness.  Endo/Heme/Allergies: Does not bruise/bleed easily.  Psychiatric/Behavioral: Negative for depression. The patient is not nervous/anxious and does not have insomnia.     Objective: BP 122/80   Ht 5\' 5"  (1.651 m)   Wt 161 lb (73 kg)   LMP 04/21/2018   BMI 26.79 kg/m   [employer]   01/20/19  9381  Weight: 161 lb (73 kg)   Body mass index is 26.79 kg/m. Physical Exam Constitutional:      General: She is not in acute distress.    Appearance: She is well-developed.  Genitourinary:     Pelvic exam was performed with patient supine.     Vagina, uterus and rectum normal.     No lesions in the vagina.     No vaginal bleeding.     No cervical motion tenderness, friability, lesion or polyp.     Uterus is mobile.     Uterus is not enlarged.     No uterine mass detected.    Uterus is midaxial.     No right or left adnexal mass present.     Right adnexa not tender.     Left  adnexa not tender.  HENT:     Head: Normocephalic and atraumatic. No laceration.     Right Ear: Hearing normal.     Left Ear: Hearing normal.     Mouth/Throat:     Pharynx: Uvula midline.  Eyes:     Pupils: Pupils are equal, round, and reactive to light.  Neck:     Musculoskeletal: Normal range of motion and neck supple.     Thyroid: No thyromegaly.  Cardiovascular:     Rate and Rhythm: Normal rate and regular rhythm.     Heart sounds: No murmur. No friction rub. No gallop.   Pulmonary:     Effort: Pulmonary effort is normal. No respiratory distress.     Breath sounds: Normal breath sounds. No wheezing.  Chest:     Breasts:        Right: No mass, skin change or tenderness.        Left: No mass, skin change or tenderness.  Abdominal:     General: Bowel sounds are normal. There is no distension.     Palpations: Abdomen is soft.     Tenderness: There is no abdominal tenderness. There is no rebound.  Musculoskeletal: Normal range of motion.  Neurological:     Mental Status: She is alert and oriented to person, place, and time.     Cranial Nerves: No cranial nerve deficit.  Skin:    General: Skin is warm and dry.  Psychiatric:        Judgment: Judgment normal.  Vitals signs reviewed.   Assessment: Annual Exam 1. Women's annual routine gynecological examination   2. Screening for cervical cancer   3. Screening for breast cancer   4. Screen for colon cancer   5. Screening for thyroid disorder   6. Screening for cholesterol level   7. Screening for diabetes mellitus   8. Encounter for vitamin deficiency screening   9. Vaginal atrophy   10. Dyspareunia due to medical condition in female     Plan:            1.  Cervical Screening-  Pap smear done today  2. Breast screening- Exam annually and mammogram scheduled  3. Colonoscopy every 10 years, Hemoccult testing after age 36  4. Labs Ordered today  5. Counseling for hormonal therapy: none  6. Vag atrophy and pain-  Replens and other options discussed, info provided              7. FRAX - FRAX score for assessing the 10 year probability for fracture calculated and discussed today.  Based on age and score today, DEXA is not scheduled.    F/U  Return  in about 1 year (around 01/20/2020) for Annual.  Barnett Applebaum, MD, [facility], [facility] 01/20/2019  9:39 AM

## 2019-01-20 NOTE — Patient Instructions (Signed)
Atrophic Vaginitis  Atrophic vaginitis is a condition in which the tissues that line the vagina become dry and thin. This condition is most common in women who have stopped having regular menstrual periods (are in menopause). This usually starts when a woman is 34-52 years old. That is the time when a woman's estrogen levels begin to drop (decrease). Estrogen is a female hormone. It helps to keep the tissues of the vagina moist. It stimulates the vagina to produce a clear fluid that lubricates the vagina for sexual intercourse. This fluid also protects the vagina from infection. Lack of estrogen can cause the lining of the vagina to get thinner and dryer. The vagina may also shrink in size. It may become less elastic. Atrophic vaginitis tends to get worse over time as a woman's estrogen level drops. What are the causes? This condition is caused by the normal drop in estrogen that happens around the time of menopause. What increases the risk? Certain conditions or situations may lower a woman's estrogen level, leading to a higher risk for atrophic vaginitis. You are more likely to develop this condition if:  You are taking medicines that block estrogen.  You have had your ovaries removed.  You are being treated for cancer with X-ray (radiation) or medicines (chemotherapy).  You have given birth or are breastfeeding.  You are older than age 58.  You smoke. What are the signs or symptoms? Symptoms of this condition include:  Pain, soreness, or bleeding during sexual intercourse (dyspareunia).  Vaginal burning, irritation, or itching.  Pain or bleeding when a speculum is used in a vaginal exam (pelvic exam).  Having burning pain when passing urine.  Vaginal discharge that is brown or yellow. In some cases, there are no symptoms. How is this diagnosed? This condition is diagnosed by taking a medical history and doing a physical exam. This will include a pelvic exam that checks the  vaginal tissues. Though rare, you may also have other tests, including:  A urine test.  A test that checks the acid balance in your vagina (acid balance test). How is this treated? Treatment for this condition depends on how severe your symptoms are. Treatment may include:  Using an over-the-counter vaginal lubricant before sex.  Using a long-acting vaginal moisturizer. - REPLENS  Using low-dose vaginal estrogen for moderate to severe symptoms that do not respond to other treatments. Options include creams, tablets, and inserts (vaginal rings). Before you use a vaginal estrogen, tell your health care provider if you have a history of: ? Breast cancer. ? Endometrial cancer. ? Blood clots. If you are not sexually active and your symptoms are very mild, you may not need treatment. Follow these instructions at home: Medicines  Take over-the-counter and prescription medicines only as told by your health care provider. Do not use herbal or alternative medicines unless your health care provider says that you can.  Use over-the-counter creams, lubricants, or moisturizers for dryness only as directed by your health care provider. General instructions  If your atrophic vaginitis is caused by menopause, discuss all of your menopause symptoms and treatment options with your health care provider.  Do not douche.  Do not use products that can make your vagina dry. These include: ? Scented feminine sprays. ? Scented tampons. ? Scented soaps.  Vaginal intercourse can help to improve blood flow and elasticity of vaginal tissue. If it hurts to have sex, try using a lubricant or moisturizer just before having intercourse. Contact a health care provider  if:  Your discharge looks different than normal.  Your vagina has an unusual smell.  You have new symptoms.  Your symptoms do not improve with treatment.  Your symptoms get worse. Summary  Atrophic vaginitis is a condition in which the  tissues that line the vagina become dry and thin. It is most common in women who have stopped having regular menstrual periods (are in menopause).  Treatment options include using vaginal lubricants and low-dose vaginal estrogen.  Contact a health care provider if your vagina has an unusual smell, or if your symptoms get worse or do not improve after treatment. This information is not intended to replace advice given to you by your health care provider. Make sure you discuss any questions you have with your health care provider. Document Released: 03/12/2015 Document Revised: 07/22/2017 Document Reviewed: 07/22/2017 Elsevier Interactive Patient Education  2019 [employer].

## 2019-01-20 NOTE — Addendum Note (Signed)
Addended by: Gae Dry on: 01/20/2019 09:45 AM   Modules accepted: Orders

## 2019-01-21 LAB — GLUCOSE, FASTING: GLUCOSE, PLASMA: 83 mg/dL (ref 65–99)

## 2019-01-21 LAB — LIPID PANEL
CHOLESTEROL TOTAL: 178 mg/dL (ref 100–199)
Chol/HDL Ratio: 5.1 ratio — ABNORMAL HIGH (ref 0.0–4.4)
HDL: 35 mg/dL — ABNORMAL LOW (ref >39)
LDL Calculated: 109 mg/dL — ABNORMAL HIGH (ref 0–99)
TRIGLYCERIDES: 169 mg/dL — AB (ref 0–149)
VLDL Cholesterol Cal: 34 mg/dL (ref 5–40)

## 2019-01-21 LAB — TSH: TSH: 1.97 u[IU]/mL (ref 0.450–4.500)

## 2019-01-21 LAB — VITAMIN D 25 HYDROXY (VIT D DEFICIENCY, FRACTURES): Vit D, 25-Hydroxy: 13 ng/mL — ABNORMAL LOW (ref 30.0–100.0)

## 2019-01-24 LAB — CYTOLOGY - PAP
Diagnosis: NEGATIVE
HPV: NOT DETECTED

## 2019-02-20 ENCOUNTER — Encounter: Payer: Self-pay | Admitting: *Deleted

## 2019-10-31 ENCOUNTER — Ambulatory Visit: Payer: BLUE CROSS/BLUE SHIELD | Attending: Internal Medicine

## 2019-10-31 DIAGNOSIS — Z20822 Contact with and (suspected) exposure to covid-19: Secondary | ICD-10-CM

## 2019-11-02 LAB — NOVEL CORONAVIRUS, NAA

## 2019-11-06 ENCOUNTER — Ambulatory Visit: Payer: BLUE CROSS/BLUE SHIELD | Attending: Internal Medicine

## 2019-11-06 DIAGNOSIS — Z20822 Contact with and (suspected) exposure to covid-19: Secondary | ICD-10-CM

## 2019-11-08 LAB — NOVEL CORONAVIRUS, NAA: SARS-CoV-2, NAA: NOT DETECTED

## 2019-11-09 ENCOUNTER — Telehealth: Payer: Self-pay | Admitting: *Deleted

## 2019-11-09 NOTE — Telephone Encounter (Signed)
Patient called ,given negative covid results .

## 2024-02-10 ENCOUNTER — Ambulatory Visit
Admission: EM | Admit: 2024-02-10 | Discharge: 2024-02-10 | Attending: Emergency Medicine | Admitting: Emergency Medicine

## 2024-02-10 DIAGNOSIS — R0789 Other chest pain: Secondary | ICD-10-CM

## 2024-02-10 DIAGNOSIS — F17200 Nicotine dependence, unspecified, uncomplicated: Secondary | ICD-10-CM | POA: Diagnosis not present

## 2024-02-10 DIAGNOSIS — R030 Elevated blood-pressure reading, without diagnosis of hypertension: Secondary | ICD-10-CM | POA: Diagnosis not present

## 2024-02-10 NOTE — ED Notes (Signed)
 Patient is being discharged from the Urgent Care and sent to the Emergency Department via personal vehicle . Per Harl Bowie, NP, patient is in need of higher level of care due to chest pain. Patient is aware and verbalizes understanding of plan of care.  Vitals:   02/10/24 0903  BP: (!) 161/95  Pulse: 97  Resp: 15  Temp: 98.1 F (36.7 C)  SpO2: 96%

## 2024-02-10 NOTE — Discharge Instructions (Signed)
 Go to ER for further evaluation of chest pain, do not eat or drink anything until seen by provider.

## 2024-02-10 NOTE — ED Provider Notes (Signed)
 [facility]    CSN: [id number] Arrival date & time: 02/10/24  0850      History   Chief Complaint Chief Complaint  Patient presents with   Chest Pain   Emesis    HPI Sabrina Petersen is a 58 y.o. female.   57 year old female, Sabrina Petersen, presents to urgent care for evaluation of chest pain for 3 days as well as palpitations and emesis daily. No known illness exposure,works at [employer]. Rates pain as 5/10 pressure midsternal  Pmh: Migraines joint pain melanoma cataract anxiety cervical neoplasm smoker 1 pack/day  The history is provided by the patient. No language interpreter was used.    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   Smoker 03/31/2016   Left upper quadrant pain 03/31/2016   Migraine without aura and without status migrainosus, not intractable 03/31/2016    Past Surgical History:  Procedure Laterality Date   APPENDECTOMY     CATARACT EXTRACTION, BILATERAL     CESAREAN SECTION     2   COLONOSCOPY  2013   ESOPHAGOGASTRODUODENOSCOPY  2013   MELANOMA EXCISION  2002   MOHS SURGERY     TONSILLECTOMY AND ADENOIDECTOMY      OB History     Gravida  2   Para  2   Term  2   Preterm      AB      Living  2      SAB      IAB      Ectopic      Multiple      Live Births               Home Medications    Prior to Admission medications   Medication Sig Start Date End Date Taking? Authorizing Provider  acetaminophen (TYLENOL) 325 MG tablet Take 650 mg by mouth every 6 (six) hours as needed.   Yes [provider]  Vaginal Lubricant (REPLENS) GEL Place 1 Applicatorful vaginally 2 (two) times a week. 01/23/19   Nadara Mustard, MD     Family History Family History  Problem Relation Age of Onset   Leukemia Mother    Heart attack Father    CAD Paternal Grandfather     Social History Social History   Tobacco Use   Smoking status: Every Day    Current packs/day: 1.00    Average packs/day: 1 pack/day for 28.0 years (28.0 ttl pk-yrs)    Types: Cigarettes   Smokeless tobacco: Never  Vaping Use   Vaping status: Never Used  Substance Use Topics   Alcohol use: No    Alcohol/week: 0.0 standard drinks of alcohol   Drug use: No     Allergies   Aspirin, Keflex [cephalexin], and Kiwi extract   Review of Systems Review of Systems  Constitutional:  Negative for fever.  Respiratory:  Positive for chest tightness.   Cardiovascular:  Positive for chest pain and palpitations.  All other systems reviewed and are negative.    Physical Exam Triage Vital Signs ED Triage Vitals  Encounter Vitals Group  BP      Systolic BP Percentile      Diastolic BP Percentile      Pulse      Resp      Temp      Temp src      SpO2      Weight      Height      Head Circumference      Peak Flow      Pain Score      Pain Loc      Pain Education      Exclude from Growth Chart    No data found.  Updated Vital Signs BP (!) 161/95 (BP Location: Left Arm)   Pulse 97   Temp 98.1 F (36.7 C) (Oral)   Resp 15   LMP 04/21/2018   SpO2 96%   Visual Acuity Right Eye Distance:   Left Eye Distance:   Bilateral Distance:    Right Eye Near:   Left Eye Near:    Bilateral Near:     Physical Exam Vitals and nursing note reviewed.  Constitutional:      Appearance: Normal appearance. She is well-developed and well-groomed.  HENT:     Head: Normocephalic.  Cardiovascular:     Rate and Rhythm: Normal rate and regular rhythm.     Heart sounds: Normal heart sounds.  Pulmonary:     Effort: Pulmonary effort is normal.  Neurological:     General: No focal deficit present.     Mental Status: She is alert and oriented  to person, place, and time.     GCS: GCS eye subscore is 4. GCS verbal subscore is 5. GCS motor subscore is 6.  Psychiatric:        Attention and Perception: Attention normal.        Mood and Affect: Mood normal.        Speech: Speech normal.        Behavior: Behavior normal. Behavior is cooperative.      UC Treatments / Results  Labs (all labs ordered are listed, but only abnormal results are displayed) Labs Reviewed - No data to display  EKG   Radiology No results found.  Procedures Procedures (including critical care time)  Medications Ordered in UC Medications - No data to display  Initial Impression / Assessment and Plan / UC Course  I have reviewed the triage vital signs and the nursing notes.  Pertinent labs & imaging results that were available during my care of the patient were reviewed by me and considered in my medical decision making (see chart for details).  Clinical Course as of 02/10/24 1002  Thu Feb 10, 2024  0854 EKG ordered for chest pain x 3 days [JD]  0903 EKG shows NSR rate 90, QTC is 380, prolonged QT when compared to 08/2009 EKG shows now t wave inversion Lead I. Pt is allergic to aspirin, took tylenol for chest pain PTA. Discussed plan of care with pt, states husband will take her to ER via POV for further evaluation. [JD]    Clinical Course User Index [JD] Devere Brem, Para March, NP  Discussed exam findings and plan of care with patient, going to ER for further evaluation of chest pain. Patient verbalized understanding to this provider.  Ddx: Chest pain, viral illness, muscle strain,anxiety Final Clinical Impressions(s) / UC Diagnoses   Final diagnoses:  Atypical chest pain  Smoker  Elevated blood pressure reading     Discharge Instructions  Go to ER for further evaluation of chest pain, do not eat or drink anything until seen by provider.     ED Prescriptions   None    PDMP not reviewed this encounter.   Clancy Gourd,  NP 02/10/24 1002

## 2024-02-10 NOTE — ED Triage Notes (Addendum)
 Sx x 3 days  Chest pain with palpitations No SOB or hypertension no blurry vision or dizziness Emesis   Patient took tylenol, last dose 630am
